# Patient Record
Sex: Male | Born: 1995 | Race: White | Hispanic: No | Marital: Single | Smoking: Never smoker
Health system: Southern US, Community
[De-identification: ages and names within clinical notes are randomized; demographics above are authoritative.]

## PROBLEM LIST (undated history)

## (undated) VITALS — BP 100/71 | HR 91 | Temp 97.8°F | Resp 16 | Ht 61.02 in | Wt 92.6 lb

## (undated) DIAGNOSIS — J45909 Unspecified asthma, uncomplicated: Secondary | ICD-10-CM

## (undated) DIAGNOSIS — F319 Bipolar disorder, unspecified: Secondary | ICD-10-CM

## (undated) DIAGNOSIS — F88 Other disorders of psychological development: Secondary | ICD-10-CM

## (undated) DIAGNOSIS — F909 Attention-deficit hyperactivity disorder, unspecified type: Secondary | ICD-10-CM

## (undated) DIAGNOSIS — F32A Depression, unspecified: Secondary | ICD-10-CM

## (undated) DIAGNOSIS — F329 Major depressive disorder, single episode, unspecified: Secondary | ICD-10-CM

## (undated) HISTORY — DX: Bipolar disorder, unspecified: F31.9

## (undated) HISTORY — PX: OTHER SURGICAL HISTORY: SHX169

## (undated) HISTORY — PX: URETHRAL DILATION: SUR417

## (undated) HISTORY — DX: Attention-deficit hyperactivity disorder, unspecified type: F90.9

---
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
## 2017-01-20 ENCOUNTER — Encounter (HOSPITAL_COMMUNITY): Payer: Self-pay | Admitting: Behavioral Health

## 2017-01-20 ENCOUNTER — Encounter (HOSPITAL_COMMUNITY): Payer: Self-pay | Admitting: *Deleted

## 2017-01-20 ENCOUNTER — Emergency Department (HOSPITAL_COMMUNITY)
Admission: EM | Admit: 2017-01-20 | Discharge: 2017-01-22 | Disposition: A | Discharge status: Other Acute Inpatient Hospital | Payer: Medicaid Other | Attending: Emergency Medicine | Admitting: Emergency Medicine

## 2017-01-20 ENCOUNTER — Ambulatory Visit (HOSPITAL_COMMUNITY)
Admission: RE | Admit: 2017-01-20 | Discharge: 2017-01-20 | Disposition: A | Discharge status: Home / Self Care | Payer: Medicaid Other | Attending: Psychiatry | Admitting: Psychiatry

## 2017-01-20 DIAGNOSIS — F329 Major depressive disorder, single episode, unspecified: Secondary | ICD-10-CM | POA: Diagnosis not present

## 2017-01-20 DIAGNOSIS — F909 Attention-deficit hyperactivity disorder, unspecified type: Secondary | ICD-10-CM | POA: Insufficient documentation

## 2017-01-20 DIAGNOSIS — R441 Visual hallucinations: Secondary | ICD-10-CM

## 2017-01-20 DIAGNOSIS — R440 Auditory hallucinations: Secondary | ICD-10-CM | POA: Diagnosis not present

## 2017-01-20 DIAGNOSIS — Z8659 Personal history of other mental and behavioral disorders: Secondary | ICD-10-CM | POA: Diagnosis not present

## 2017-01-20 DIAGNOSIS — Z79899 Other long term (current) drug therapy: Secondary | ICD-10-CM | POA: Diagnosis not present

## 2017-01-20 DIAGNOSIS — R443 Hallucinations, unspecified: Secondary | ICD-10-CM | POA: Diagnosis not present

## 2017-01-20 HISTORY — DX: Other disorders of psychological development: F88

## 2017-01-20 LAB — RAPID URINE DRUG SCREEN, HOSP PERFORMED
Amphetamines: NOT DETECTED
Barbiturates: NOT DETECTED
Benzodiazepines: NOT DETECTED
COCAINE: NOT DETECTED
Opiates: NOT DETECTED
TETRAHYDROCANNABINOL: NOT DETECTED

## 2017-01-20 LAB — COMPREHENSIVE METABOLIC PANEL
ALK PHOS: 108 U/L (ref 38–126)
ALT: 15 U/L — AB (ref 17–63)
AST: 20 U/L (ref 15–41)
Albumin: 4.7 g/dL (ref 3.5–5.0)
Anion gap: 10 (ref 5–15)
BILIRUBIN TOTAL: 0.6 mg/dL (ref 0.3–1.2)
BUN: 10 mg/dL (ref 6–20)
CALCIUM: 9.8 mg/dL (ref 8.9–10.3)
CO2: 27 mmol/L (ref 22–32)
CREATININE: 0.82 mg/dL (ref 0.61–1.24)
Chloride: 103 mmol/L (ref 101–111)
GFR calc Af Amer: >60 mL/min (ref >60)
GFR calc non Af Amer: >60 mL/min (ref >60)
GLUCOSE: 97 mg/dL (ref 65–99)
Potassium: 3.7 mmol/L (ref 3.5–5.1)
SODIUM: 140 mmol/L (ref 135–145)
TOTAL PROTEIN: 7.4 g/dL (ref 6.5–8.1)

## 2017-01-20 LAB — CBC
HCT: 48.4 % (ref 39.0–52.0)
HEMOGLOBIN: 16.4 g/dL (ref 13.0–17.0)
MCH: 27.7 pg (ref 26.0–34.0)
MCHC: 33.9 g/dL (ref 30.0–36.0)
MCV: 81.9 fL (ref 78.0–100.0)
Platelets: 223 10*3/uL (ref 150–400)
RBC: 5.91 MIL/uL — ABNORMAL HIGH (ref 4.22–5.81)
RDW: 13.8 % (ref 11.5–15.5)
WBC: 5 10*3/uL (ref 4.0–10.5)

## 2017-01-20 LAB — ETHANOL: Alcohol, Ethyl (B): <5 mg/dL (ref <5)

## 2017-01-20 LAB — SALICYLATE LEVEL: Salicylate Lvl: <7 mg/dL (ref 2.8–30.0)

## 2017-01-20 LAB — ACETAMINOPHEN LEVEL: Acetaminophen (Tylenol), Serum: <10 ug/mL — ABNORMAL LOW (ref 10–30)

## 2017-01-20 MED ORDER — RISPERIDONE 1 MG PO TABS
1.0000 mg | ORAL_TABLET | Freq: Every day | ORAL | Status: DC
  Administered 2017-01-20 – 2017-01-21 (×2): 1 mg via ORAL
  Filled 2017-01-20 (×2): qty 1

## 2017-01-20 MED ORDER — MELATONIN 5 MG PO TABS: 1.0000 | ORAL_TABLET | Freq: Every day | ORAL | Status: DC

## 2017-01-20 MED ORDER — ALBUTEROL SULFATE HFA 108 (90 BASE) MCG/ACT IN AERS: 2.0000 | INHALATION_SPRAY | RESPIRATORY_TRACT | Status: DC | PRN

## 2017-01-20 MED ORDER — ONDANSETRON HCL 4 MG PO TABS: 4.0000 mg | ORAL_TABLET | Freq: Three times a day (TID) | ORAL | Status: DC | PRN

## 2017-01-20 MED ORDER — ACETAMINOPHEN 325 MG PO TABS: 650.0000 mg | ORAL_TABLET | ORAL | Status: DC | PRN

## 2017-01-20 MED ORDER — ALUM & MAG HYDROXIDE-SIMETH 200-200-20 MG/5ML PO SUSP: 30.0000 mL | ORAL | Status: DC | PRN

## 2017-01-20 MED ORDER — IBUPROFEN 400 MG PO TABS: 600.0000 mg | ORAL_TABLET | Freq: Three times a day (TID) | ORAL | Status: DC | PRN

## 2017-01-20 MED ORDER — GUANFACINE HCL ER 1 MG PO TB24
3.0000 mg | ORAL_TABLET | Freq: Every day | ORAL | Status: DC
  Administered 2017-01-20 – 2017-01-21 (×2): 3 mg via ORAL
  Filled 2017-01-20 (×2): qty 3

## 2017-01-20 MED ORDER — MELATONIN 3 MG PO TABS
4.5000 mg | ORAL_TABLET | Freq: Every day | ORAL | Status: DC
  Administered 2017-01-20 – 2017-01-21 (×2): 4.5 mg via ORAL
  Filled 2017-01-20 (×2): qty 1.5

## 2017-01-20 NOTE — ED Triage Notes (Signed)
Meal ordered

## 2017-01-20 NOTE — ED Provider Notes (Signed)
[facility] DEPT Provider Note   CSN: [id number] Arrival date & time: 01/20/17  1341     History   Chief Complaint Chief Complaint  Patient presents with  . Medical Clearance    HPI Elijah Thomas is a 21 y.o. male.  HPI   Pt with hx bipolar disorder, ADHD, hypersensitive sensory processing p/w worsening hallucinations and family concerns that he might hurt himself or others.  States he has had visual hallucinations of shadows for many years, auditory hallucinations or whispering or mumbling but also with command hallucinations telling him to find a way to hurt himself.  He denies SI, HI.  Notes he has been on medications for a long time and he feels that his dosage or medication needs to be adjusted since his symptoms are worsening.  Has done nothing to hurt himself.    Past Medical History:  Diagnosis Date  . ADHD (attention deficit hyperactivity disorder)   . Asthma   . Bipolar disorder ([place])   . Depression   . 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 Seizure-like activity ([place]) 04/07/2015  . 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 Seizure ([place]) 04/07/2015  . Acne 01/13/2014  . Fever 11/24/2013  . Cough 11/10/2013  . Deviated septum 11/10/2013  . Environmental allergies 04/13/2013  . PTSD (post-traumatic stress disorder) 11/30/2012  . ADHD (attention deficit hyperactivity disorder), combined type 11/30/2012  . Tension headache 01/16/2012  . Mouth breathing 01/16/2012  . Eczema 12/12/2011  . Diarrhea 10/08/2011  . Encounter for long-term (current) use of other medications 09/10/2011  . MDD (major depressive disorder), recurrent episode, severe ([place]) 09/10/2011  . Joint pain 09/10/2011  . Fatigue 08/14/2011    Past Surgical History:  Procedure Laterality Date  . meatal stenosis    . URETHRAL DILATION         Home  Medications    Prior to Admission medications   Medication Sig Start Date End Date Taking? Authorizing Provider  albuterol (PROAIR HFA) 108 (90 BASE) MCG/ACT inhaler Inhale 2 puffs into the lungs every 4 (four) hours as needed for wheezing or shortness of breath. 11/10/13  Yes Midge Minium, MD  GuanFACINE HCl 3 MG TB24 Take 1 tablet (3 mg total) by mouth at bedtime. 06/21/16  Yes Samantha Tripp Dowless, PA-C  Melatonin 5 MG TABS Take 1 tablet by mouth at bedtime.   Yes Historical Provider, MD  risperiDONE (RISPERDAL) 1 MG tablet Take 1 tablet (1 mg total) by mouth 2 (two) times daily. 1 in the morning and 1 at night Patient taking differently: Take 1 mg by mouth at bedtime. 1 in the morning and 1 at night 06/21/16  Yes Samantha Tripp Dowless, PA-C  loratadine (CLARITIN) 10 MG tablet TAKE 1 TABLET BY MOUTH DAILY Patient not taking: Reported on 01/20/2017 07/05/15   Midge Minium, MD  methylphenidate (CONCERTA) 18 MG CR tablet Take 18 mg by mouth 2 (two) times daily. 1 tab in the morning and 1 at 1 pm    Historical Provider, MD    Family History Family History  Problem Relation Age of Onset  . Alcohol abuse      parent  . Arthritis      parent  . Hyperlipidemia      parent  . Hyperlipidemia      grandparent  . Heart disease  grandparent  . Hypertension      grandparent  . Sudden death Father   . Mental illness      parent  . Diabetes      grandparent  . Liver cancer Maternal Grandfather     Social History Social History  Substance Use Topics  . Smoking status: Never Smoker  . Smokeless tobacco: Never Used  . Alcohol use No     Allergies   Nickel   Review of Systems Review of Systems  All other systems reviewed and are negative.    Physical Exam Updated Vital Signs BP 127/78 (BP Location: Right Arm)   Pulse 61   Temp 97.9 F (36.6 C) (Oral)   Resp 22   Ht 5' 10.5" (1.791 m)   Wt 54.4 kg   SpO2 100%   BMI 16.97 kg/m   Physical Exam    Constitutional: He appears well-developed and well-nourished. No distress.  HENT:  Head: Normocephalic and atraumatic.  Neck: Neck supple.  Cardiovascular: Normal rate and regular rhythm.   Pulmonary/Chest: Effort normal and breath sounds normal. No respiratory distress. He has no wheezes. He has no rales.  Abdominal: Soft. He exhibits no distension and no mass. There is no tenderness. There is no rebound and no guarding.  Neurological: He is alert. He exhibits normal muscle tone.  Skin: He is not diaphoretic.  Nursing note and vitals reviewed.    ED Treatments / Results  Labs (all labs ordered are listed, but only abnormal results are displayed) Labs Reviewed  COMPREHENSIVE METABOLIC PANEL - Abnormal; Notable for the following:       Result Value   ALT 15 (*)    All other components within normal limits  ACETAMINOPHEN LEVEL - Abnormal; Notable for the following:    Acetaminophen (Tylenol), Serum <10 (*)    All other components within normal limits  CBC - Abnormal; Notable for the following:    RBC 5.91 (*)    All other components within normal limits  ETHANOL  SALICYLATE LEVEL  RAPID URINE DRUG SCREEN, HOSP PERFORMED    EKG  EKG Interpretation None       Radiology No results found.  Procedures Procedures (including critical care time)  Medications Ordered in ED Medications  ibuprofen (ADVIL,MOTRIN) tablet 600 mg (not administered)  acetaminophen (TYLENOL) tablet 650 mg (not administered)  ondansetron (ZOFRAN) tablet 4 mg (not administered)  alum & mag hydroxide-simeth (MAALOX/MYLANTA) 200-200-20 MG/5ML suspension 30 mL (not administered)     Initial Impression / Assessment and Plan / ED Course  I have reviewed the triage vital signs and the nursing notes.  Pertinent labs & imaging results that were available during my care of the patient were reviewed by me and considered in my medical decision making (see chart for details).     Afebrile, nontoxic  patient with chronic hallucinations and command hallucinations telling him to hurt himself.     Housemates concerned about his and their safety.  He denies SI, HI.  He is voluntary, would like a medication adjustment.  Medically cleared.  Plan for TTS.    Final Clinical Impressions(s) / ED Diagnoses   Final diagnoses:  History of command hallucinations  Auditory hallucinations  Visual hallucinations    New Prescriptions New Prescriptions   No medications on file     Clayton Bibles, PA-C 01/20/17 2011    Gareth Morgan, MD 01/22/17 [phone]

## 2017-01-20 NOTE — H&P (Signed)
Behavioral Health Medical Screening Exam  Elijah Thomas is an 21 y.o. male.  Total Time spent with patient: 15 minutes  Psychiatric Specialty Exam: Physical Exam  Constitutional: He is oriented to person, place, and time. He appears well-developed and well-nourished.  HENT:  Head: Normocephalic.  Neck: Normal range of motion.  Cardiovascular: Normal rate and regular rhythm.   Respiratory: Effort normal and breath sounds normal.  GI: Soft. Bowel sounds are normal.  Musculoskeletal: Normal range of motion.  Neurological: He is alert and oriented to person, place, and time.  Skin: Skin is warm and dry.    Review of Systems  Psychiatric/Behavioral: Positive for depression and hallucinations. Negative for memory loss, substance abuse and suicidal ideas. The patient is nervous/anxious. The patient does not have insomnia.   All other systems reviewed and are negative.   Blood pressure 113/75, pulse 65, temperature 97.6 F (36.4 C), temperature source Oral, resp. rate 16, SpO2 100 %.There is no height or weight on file to calculate BMI.  General Appearance: Casual  Eye Contact:  Good  Speech:  Clear and Coherent and Normal Rate  Volume:  Normal  Mood:  Anxious and Depressed  Affect:  Appropriate and Congruent  Thought Process:  Coherent and Goal Directed  Orientation:  Full (Time, Place, and Person)  Thought Content:  Logical  Suicidal Thoughts:  No  Homicidal Thoughts:  No  Memory:  Immediate;   Good Recent;   Good Remote;   Fair  Judgement:  Good  Insight:  Fair  Psychomotor Activity:  Normal  Concentration: Concentration: Good and Attention Span: Fair  Recall:  [employer] of Knowledge:Fair  Language: Good  Akathisia:  No  Handed:  Right  AIMS (if indicated):     Assets:  Communication Skills Desire for Improvement Financial Resources/Insurance Housing Leisure Time Physical Health Resilience Social Support Transportation Vocational/Educational  Sleep:   Good     Musculoskeletal: Strength & Muscle Tone: within normal limits Gait & Station: normal Patient leans: N/A  Blood pressure 113/75, pulse 65, temperature 97.6 F (36.4 C), temperature source Oral, resp. rate 16, SpO2 100 %.  Recommendations:  Based on my evaluation the patient does not appear to have an emergency medical condition.  Pt meets criteria for inpatient psychiatric admission when medically cleared.  TTS to seek placement.   Ethelene Hal, NP 01/20/2017, 1:09 PM
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
## 2017-01-20 NOTE — ED Triage Notes (Signed)
Pt parent to room with PT. Paren t assisted Pt with ordering dinner.

## 2017-01-20 NOTE — ED Triage Notes (Signed)
Report given to EDP Schlossman.

## 2017-01-20 NOTE — ED Triage Notes (Signed)
Pt sent here from [facility] for medical clearance.  Pt was brought there by his sister who he lives with because he was "seeing shadows" and hearing voices that were telling him to hurt himself.  Per sister, pt has violent outbursts where he threatens to throw things, but he not acted violently yet. 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

## 2017-01-20 NOTE — ED Triage Notes (Signed)
Meal arrived but Pt refused food. Pt reported he was just tired.

## 2017-01-20 NOTE — ED Notes (Signed)
Staffing office called at this time to request sitter

## 2017-01-20 NOTE — BH Assessment (Signed)
Under Review: [place], [place], [place], [place], [place], Teacher, music

## 2017-01-21 NOTE — ED Notes (Signed)
Per Meghan, [facility] - pt has been accepted to [facility] - 3E - Dr Owens Shark.

## 2017-01-21 NOTE — ED Notes (Signed)
Regular Diet has been ordered for Lunch. 

## 2017-01-21 NOTE — ED Notes (Signed)
Sgt Paschal aware IVC papers have been served. Advised transport should be arriving between 0800-0830 in am (01/22/17).

## 2017-01-21 NOTE — ED Notes (Signed)
Regular diet ordered for patient. 

## 2017-01-21 NOTE — ED Notes (Signed)
Called in pt's dinner order request. Pt on phone at nurses' desk notifying his sister he will be transported to [facility] in am.

## 2017-01-21 NOTE — ED Notes (Signed)
Elijah Thomas [id number] - aware pt to be transported in am.

## 2017-01-21 NOTE — BH Assessment (Signed)
This clinician received IVC paperwork. Paperwork faxed to [facility] and spoke to Ken Caryl.  She heard from the [facility], patient will be transported in the morning.

## 2017-01-21 NOTE — Progress Notes (Signed)
Accepted to [facility] unit by Dr. Merleen Nicely. Elijah Thomas in intake requests report be called to [phone] once IVC has been served.  Notified [facility].  Sharren Bridge, MSW, LCSW Clinical Social Work, Disposition  01/21/2017 [phone]

## 2017-01-21 NOTE — ED Notes (Addendum)
States is willing to go to inpt psych tx. States last inpt was at the age of 17-y-o - [facility]. States has been living w/his sister x 4-5 months d/t mother moved to [place]. States has an appt w/[facility] tomorrow (01/22/17).

## 2017-01-21 NOTE — ED Notes (Signed)
Had pt get up and walk around to determine if BP was going to stay low. Will recheck before giving medication. Rechecked BP was 114/56 per tech. Pt given medication

## 2017-01-21 NOTE — ED Notes (Signed)
Pt sister to bring clothes tomorrow around 730 am

## 2017-01-21 NOTE — ED Notes (Signed)
Pt doesn't want to eat, pt currently sleeping

## 2017-01-21 NOTE — ED Notes (Signed)
IVC papers faxed to Magistrate - verified receipt.

## 2017-01-21 NOTE — ED Notes (Signed)
IVC papers - original placed in folder for Magistrate, copy faxed to [facility], copy sent to Medical Records, and all 3 sets brought in by [employer] placed on clipboard.

## 2017-01-21 NOTE — ED Notes (Signed)
Sgt Paschal aware of need for transport after IVC papers have been served. Advised will be transported tomorrow. Requested to be notified when receive papers.

## 2017-01-21 NOTE — ED Notes (Signed)
Pt asked if he could call his sister to find out the status on her bringing his clothes before he leaves for [facility] in the am. This RN allowed pt to call sister.

## 2017-01-21 NOTE — ED Notes (Signed)
Pt's sister called - aware pt to be transported to [facility] in am and advised she is going to bring pt's belongings - one bookbag. Voiced understanding of visiting time - 1730-1800.

## 2017-01-22 ENCOUNTER — Encounter (HOSPITAL_COMMUNITY): Payer: Self-pay | Admitting: Emergency Medicine
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
